# Patient Record
Sex: Male | Born: 1950 | Race: White | Hispanic: No | Marital: Single | Smoking: Current every day smoker
Health system: Southern US, Community
[De-identification: ages and names within clinical notes are randomized; demographics above are authoritative.]

## PROBLEM LIST (undated history)

## (undated) DIAGNOSIS — I639 Cerebral infarction, unspecified: Secondary | ICD-10-CM

## (undated) DIAGNOSIS — I2510 Atherosclerotic heart disease of native coronary artery without angina pectoris: Secondary | ICD-10-CM

---

## 2011-06-13 ENCOUNTER — Emergency Department (HOSPITAL_COMMUNITY): Payer: Medicare Other

## 2011-06-13 ENCOUNTER — Encounter: Payer: Self-pay | Admitting: Emergency Medicine

## 2011-06-13 ENCOUNTER — Emergency Department (HOSPITAL_COMMUNITY)
Admission: EM | Admit: 2011-06-13 | Discharge: 2011-06-13 | Disposition: A | Discharge status: Home / Self Care | Payer: Medicare Other | Attending: Emergency Medicine | Admitting: Emergency Medicine

## 2011-06-13 DIAGNOSIS — S0083XA Contusion of other part of head, initial encounter: Secondary | ICD-10-CM | POA: Insufficient documentation

## 2011-06-13 DIAGNOSIS — F17200 Nicotine dependence, unspecified, uncomplicated: Secondary | ICD-10-CM | POA: Insufficient documentation

## 2011-06-13 DIAGNOSIS — S0003XA Contusion of scalp, initial encounter: Secondary | ICD-10-CM | POA: Insufficient documentation

## 2011-06-13 DIAGNOSIS — S42133A Displaced fracture of coracoid process, unspecified shoulder, initial encounter for closed fracture: Secondary | ICD-10-CM | POA: Insufficient documentation

## 2011-06-13 DIAGNOSIS — W11XXXA Fall on and from ladder, initial encounter: Secondary | ICD-10-CM | POA: Insufficient documentation

## 2011-06-13 DIAGNOSIS — I2510 Atherosclerotic heart disease of native coronary artery without angina pectoris: Secondary | ICD-10-CM | POA: Insufficient documentation

## 2011-06-13 DIAGNOSIS — M25519 Pain in unspecified shoulder: Secondary | ICD-10-CM | POA: Insufficient documentation

## 2011-06-13 DIAGNOSIS — R51 Headache: Secondary | ICD-10-CM | POA: Insufficient documentation

## 2011-06-13 DIAGNOSIS — W19XXXA Unspecified fall, initial encounter: Secondary | ICD-10-CM

## 2011-06-13 DIAGNOSIS — S42123A Displaced fracture of acromial process, unspecified shoulder, initial encounter for closed fracture: Secondary | ICD-10-CM | POA: Insufficient documentation

## 2011-06-13 DIAGNOSIS — Z8673 Personal history of transient ischemic attack (TIA), and cerebral infarction without residual deficits: Secondary | ICD-10-CM | POA: Insufficient documentation

## 2011-06-13 DIAGNOSIS — S2239XA Fracture of one rib, unspecified side, initial encounter for closed fracture: Secondary | ICD-10-CM | POA: Insufficient documentation

## 2011-06-13 DIAGNOSIS — M542 Cervicalgia: Secondary | ICD-10-CM | POA: Insufficient documentation

## 2011-06-13 HISTORY — DX: Cerebral infarction, unspecified: I63.9

## 2011-06-13 HISTORY — DX: Atherosclerotic heart disease of native coronary artery without angina pectoris: I25.10

## 2011-06-13 LAB — CBC
HCT: 43.5 % (ref 39.0–52.0)
Hemoglobin: 15.8 g/dL (ref 13.0–17.0)
MCH: 31.2 pg (ref 26.0–34.0)
RBC: 5.07 MIL/uL (ref 4.22–5.81)

## 2011-06-13 LAB — COMPREHENSIVE METABOLIC PANEL
ALT: 14 U/L (ref 0–53)
Alkaline Phosphatase: 66 U/L (ref 39–117)
BUN: 14 mg/dL (ref 6–23)
CO2: 24 mEq/L (ref 19–32)
Calcium: 8.7 mg/dL (ref 8.4–10.5)
GFR calc Af Amer: >90 mL/min (ref >90)
GFR calc non Af Amer: >90 mL/min (ref >90)
Glucose, Bld: 113 mg/dL — ABNORMAL HIGH (ref 70–99)
Sodium: 134 mEq/L — ABNORMAL LOW (ref 135–145)

## 2011-06-13 LAB — PROTIME-INR: Prothrombin Time: 13.7 seconds (ref 11.6–15.2)

## 2011-06-13 MED ORDER — FENTANYL CITRATE 0.05 MG/ML IJ SOLN: 50.0000 ug | Freq: Once | INTRAMUSCULAR | Status: DC

## 2011-06-13 MED ORDER — ONDANSETRON HCL 4 MG/2ML IJ SOLN
4.0000 mg | Freq: Once | INTRAMUSCULAR | Status: AC
  Administered 2011-06-13: 4 mg via INTRAVENOUS
  Filled 2011-06-13 (×2): qty 2

## 2011-06-13 MED ORDER — FENTANYL CITRATE 0.05 MG/ML IJ SOLN
50.0000 ug | Freq: Once | INTRAMUSCULAR | Status: AC
  Administered 2011-06-13: 50 ug via INTRAVENOUS

## 2011-06-13 MED ORDER — FENTANYL CITRATE 0.05 MG/ML IJ SOLN
INTRAMUSCULAR | Status: AC
  Administered 2011-06-13: 50 ug via INTRAVENOUS
  Filled 2011-06-13: qty 2

## 2011-06-13 MED ORDER — HYDROCODONE-ACETAMINOPHEN 5-500 MG PO TABS: 1.0000 | ORAL_TABLET | Freq: Four times a day (QID) | ORAL | Status: AC | PRN

## 2011-06-13 MED ORDER — FENTANYL CITRATE 0.05 MG/ML IJ SOLN
50.0000 ug | Freq: Once | INTRAMUSCULAR | Status: AC
  Administered 2011-06-13: 50 ug via INTRAVENOUS
  Filled 2011-06-13: qty 2

## 2011-06-13 NOTE — ED Notes (Signed)
Received bedside report from [place], [place].  Patient currently sitting up in bed; no respiratory or acute distress noted.  Patient updated on plan of care; informed patient that we are currently waiting on discharge papers from EDP.  Patient given blue scrub top to wear home (original shirt was cut off).  Patient has no other questions or concerns at this time.  Introduced self to patient and updated whiteboard in room. Will continue to monitor.

## 2011-06-13 NOTE — ED Notes (Signed)
Patient given copy of discharge paperwork; went over discharge instructions with patient.  Instructed patient to follow up with primary care physician within three days, to follow up with orthopedic surgery, and to take Vicodin as directed.  Instructed to return to the ED for new, worsening, or concerning symptoms.

## 2011-06-13 NOTE — ED Notes (Signed)
Pt on a ladder 10 feet high and ladder "gave way", no loc, pt c/o left shoulder and arm pain and into his back and shoulder blade.

## 2011-06-13 NOTE — ED Notes (Signed)
PA aware of pt's mechanism of injury,complaints and location.

## 2011-06-13 NOTE — ED Notes (Signed)
Ortho at bedside; apply foam sling.

## 2011-06-13 NOTE — ED Provider Notes (Addendum)
History     CSN: [id number]  Arrival date & time 06/13/11  1402   First MD Initiated Contact with Patient 06/13/11 1419      Chief Complaint  Patient presents with  . Fall    fall 10 feet from ladder. landed on left side and left shoulder deformity , arrived on LSB fully immobilized.  no loc. c collar in place.    (Consider location/radiation/quality/duration/timing/severity/associated sxs/prior treatment) HPI  Pt presents to the ED after having fallen off of a 10 foot ladder that "gave way". Pt denies falling off of the later for dizziness, headache, chest pain, weakness, syncope. The fall was mechanical. He states he fell on his right arm, which he has some paralysis in, and hit his shoulder and head as well. The patient describes his pain as being in his head and right shoulder. Denies pain anywhere else or any other symptoms. Pt is currently on LSB and in C-collar. Pt denies being on blood thinners.  Past Medical History  Diagnosis Date  . Coronary artery disease     mi with 2 stents placed in 2010  . Stroke     during coratid artery surgery and left hand weakness. since.    History reviewed. No pertinent past surgical history.  History reviewed. No pertinent family history.  History  Substance Use Topics  . Smoking status: Current Everyday Smoker -- 0.5 packs/day for 40 years    Types: Cigarettes  . Smokeless tobacco: Not on file  . Alcohol Use: No      Review of Systems  All other systems reviewed and are negative.    Allergies  Penicillins  Home Medications  No current outpatient prescriptions on file.  BP 128/78  Pulse 71  Temp(Src) 98.6 F (37 C) (Oral)  Resp 16  Ht 5\' 7"  (1.702 m)  Wt 155 lb (70.308 kg)  BMI 24.28 kg/m2  SpO2 96%  Physical Exam  Constitutional: He is oriented to person, place, and time. He appears well-developed and well-nourished.  HENT:  Head: Normocephalic and atraumatic.  Eyes: EOM are normal. Pupils are equal, round,  and reactive to light.  Neck: Normal range of motion.  Cardiovascular: Normal rate and regular rhythm.   Pulmonary/Chest: Effort normal and breath sounds normal.  Musculoskeletal:       Right shoulder: He exhibits decreased range of motion, tenderness, bony tenderness, swelling, pain and decreased strength. He exhibits no crepitus, no deformity, no laceration and no spasm.       Arms: Neurological: He is oriented to person, place, and time.  Skin: Skin is warm and dry.    ED Course  Procedures (including critical care time)  Labs Reviewed  CBC - Abnormal; Notable for the following:    WBC 15.0 (*)    MCHC 36.3 (*)    All other components within normal limits  COMPREHENSIVE METABOLIC PANEL - Abnormal; Notable for the following:    Sodium 134 (*)    Glucose, Bld 113 (*)    All other components within normal limits  PROTIME-INR  LAB REPORT - SCANNED        DG Pelvis 1-2 Views (Final result)   Result time:06/13/11 [id number]    Final result by Rad Results In Interface (06/13/11 18:48:17)    Narrative:   *RADIOLOGY REPORT*  Clinical Data: 61 year old male with pelvic pain following fall.  PELVIS - 1-2 VIEW  Comparison: None  Findings: No evidence of acute fracture, subluxation or dislocation identified.  No radio-opaque foreign  bodies are present.  No focal bony lesions are noted.  The joint spaces are unremarkable.  IMPRESSION: No evidence of acute bony abnormality.  Original Report Authenticated By: Rosendo Gros, M.D.            DG Abd 1 View (Final result)   Result time:06/13/11 [id number]    Final result by Rad Results In Interface (06/13/11 18:47:25)    Narrative:   *RADIOLOGY REPORT*  Clinical Data: Fall from ladder  ABDOMEN - 1 VIEW  Comparison: None.  Findings: Nonobstructive bowel gas pattern.  Visualized osseous structures are within normal limits.  IMPRESSION: No evidence of bowel obstruction.  Original Report Authenticated By: Charline Bills,  M.D.            DG Chest 2 View (Final result)   Result time:06/13/11 [phone]    Final result by Rad Results In Interface (06/13/11 18:46:18)    Narrative:   *RADIOLOGY REPORT*  Clinical Data: Fall from ladder  CHEST - 2 VIEW  Comparison: None.  Findings: Lungs are clear. No pleural effusion or pneumothorax.  Cardiomediastinal silhouette is within normal limits.  Mild degenerative changes of the visualized thoracolumbar spine. Cervical spine fixation hardware.  Left posterior eighth rib fracture, age indeterminate.  IMPRESSION: No evidence of acute cardiopulmonary disease.  Left posterior eighth rib fracture, age indeterminate.  Original Report Authenticated By: Charline Bills, M.D.            DG Hand Complete Left (Final result)   Result time:06/13/11 1628    Final result by Rad Results In Interface (06/13/11 16:28:47)    Narrative:   *RADIOLOGY REPORT*  Clinical Data: Trauma/fall from ladder  LEFT HAND - COMPLETE 3+ VIEW  Comparison: None.  Findings: No fracture is seen.  Chronic deformity/subluxation of the first digit at the MCP joint.  Narrowing at the radiocarpal articulation.  The visualized soft tissues are unremarkable.  IMPRESSION: No fracture is seen.  Chronic deformity/subluxation of the first digit at the MCP joint.  Original Report Authenticated By: Charline Bills, M.D.            DG Forearm Left (Final result)   Result time:06/13/11 1627    Final result by Rad Results In Interface (06/13/11 16:27:17)    Narrative:   *RADIOLOGY REPORT*  Clinical Data: Trauma/fall from ladder  LEFT FOREARM - 2 VIEW  Comparison: None.  Findings: No fracture or dislocation is seen.  The visualized soft tissues are unremarkable.  IMPRESSION: No fracture or dislocation is seen.  Original Report Authenticated By: Charline Bills, M.D.            DG Elbow Complete Left (Final result)   Result time:06/13/11 1626    Final result by Rad  Results In Interface (06/13/11 16:26:46)    Narrative:   *RADIOLOGY REPORT*  Clinical Data: Trauma/fall from ladder  LEFT ELBOW - COMPLETE 3+ VIEW  Comparison: None.  Findings: No fracture or dislocation is seen.  The joint spaces are preserved.  The visualized soft tissues are unremarkable.  No displaced elbow joint fat pads to suggest an elbow joint effusion.  IMPRESSION: No fracture or dislocation is seen.  Original Report Authenticated By: Charline Bills, M.D.            DG Shoulder Left (Final result)   Result time:06/13/11 1625    Final result by Rad Results In Interface (06/13/11 16:25:24)    Narrative:   *RADIOLOGY REPORT*  Clinical Data: Trauma/fall from ladder, left shoulder pain  LEFT SHOULDER -  2+ VIEW  Comparison: None.  Findings: Mildly displaced fractures involving the acromion and coracoid process.  Healing fracture of the left fourth rib.  Cervical spine fixation hardware.  IMPRESSION: Mildly displaced fractures involving the acromion and coracoid process.  Original Report Authenticated By: Charline Bills, M.D.            CT Head Wo Contrast (Final result)   Result time:06/13/11 1459    Final result by Rad Results In Interface (06/13/11 14:59:56)    Narrative:   *RADIOLOGY REPORT*  Clinical Data: Fall from ladder, headache/neck pain, occipital hematoma  CT HEAD WITHOUT CONTRAST CT CERVICAL SPINE WITHOUT CONTRAST  Technique: Multidetector CT imaging of the head and cervical spine was performed following the standard protocol without intravenous contrast. Multiplanar CT image reconstructions of the cervical spine were also generated.  Comparison: None  CT HEAD  Findings: No evidence of parenchymal hemorrhage or extra-axial fluid collection. No mass lesion, mass effect, or midline shift.  No CT evidence of acute infarction.  Cerebral volume is age appropriate. No ventriculomegaly.  The visualized paranasal sinuses  are essentially clear. The mastoid air cells are unopacified.  Small extracranial hematoma overlying the left parietal scalp.  No underlying osseous abnormality. No evidence of calvarial fracture.  IMPRESSION: Small extracranial hematoma overlying the left parietal scalp.  No evidence of calvarial fracture.  No evidence of acute intracranial abnormality.  CT CERVICAL SPINE  Findings: Normal cervical lordosis.  No evidence of fracture or dislocation. Vertebral body heights are maintained. Dens appears intact.  No prevertebral soft tissue swelling.  Anterior cervical fixation with interbody fusion at C5-6. No evidence of hardware complication.  Mild multilevel degenerate changes.  Visualized lung apices are clear.  IMPRESSION:  No evidence of traumatic injury to the cervical spine.  Status post ACDF at C5-6.  Original Report Authenticated By: Charline Bills, M.D.            CT Cervical Spine Wo Contrast (Final result)   Result time:06/13/11 1459    Final result by Rad Results In Interface (06/13/11 14:59:56)    Narrative:   *RADIOLOGY REPORT*  Clinical Data: Fall from ladder, headache/neck pain, occipital hematoma  CT HEAD WITHOUT CONTRAST CT CERVICAL SPINE WITHOUT CONTRAST  Technique: Multidetector CT imaging of the head and cervical spine was performed following the standard protocol without intravenous contrast. Multiplanar CT image reconstructions of the cervical spine were also generated.  Comparison: None  CT HEAD  Findings: No evidence of parenchymal hemorrhage or extra-axial fluid collection. No mass lesion, mass effect, or midline shift.  No CT evidence of acute infarction.  Cerebral volume is age appropriate. No ventriculomegaly.  The visualized paranasal sinuses are essentially clear. The mastoid air cells are unopacified.  Small extracranial hematoma overlying the left parietal scalp.  No underlying osseous abnormality. No  evidence of calvarial fracture.  IMPRESSION: Small extracranial hematoma overlying the left parietal scalp.  No evidence of calvarial fracture.  No evidence of acute intracranial abnormality.  CT CERVICAL SPINE  Findings: Normal cervical lordosis.  No evidence of fracture or dislocation. Vertebral body heights are maintained. Dens appears intact.  No prevertebral soft tissue swelling.  Anterior cervical fixation with interbody fusion at C5-6. No evidence of hardware complication.  Mild multilevel degenerate changes.  Visualized lung apices are clear.  IMPRESSION:  No evidence of traumatic injury to the cervical spine.  Status post ACDF at C5-6.  Original Report Authenticated By: Charline Bills, M.D.      1. Fracture of acromion  of scapula   2. Fracture of coracoid process of scapula   3. Fall       MDM    Pt has received a CT of the head and neck which show no acute findings but do show a right parietal extracranial hematoma. The patients Shoulder xray is positive for an acromion fracture and a caracoid process fracture.  After the patient being examined by Dr. Rosalia Hammers I have added on a CBC, PT/INR, CMP, ABD and PELV xray and a chest xray to exclude any other injuries. The patient is awake and alert. Pt currently being moved to a room from the hallway for re-examination.   End of shift, pts car will be handed off to [place], the ER Resident on the Blue side.       Dorthula Matas, PA 06/15/11 0640  Dorthula Matas, PA 07/27/11 [phone]

## 2011-06-13 NOTE — ED Provider Notes (Signed)
1600-Patient care transferred from PA. 61 year old male with recent fall from ladder. Complaining of left shoulder pain. X-rays of left shoulder show fractures of the coracoid and acromion process. X-rays of left hand and left elbow both negative for acute fracture. CT of head and neck negative. Lab work unremarkable. Currently awaiting chest x-ray and pelvis x-ray. A sling for comfort for shoulder fractures. Made to films negative for acute fracture. All been reviewed by me. Discharge home with Vicodin and orthopedic follow up in 2 weeks. Also instructed to followup with PCP in 3-5 days.   Elwin Mocha, MD 06/13/11 [phone]

## 2011-06-13 NOTE — ED Notes (Signed)
Charge nurse aware pt in hallway and injuries.

## 2011-06-13 NOTE — Progress Notes (Signed)
Orthopedic Tech Progress Note Patient Details:  Joel Reyes [birth year] [id number]  Other Ortho Devices Type of Ortho Device: Other (comment) (foam arm sling) Ortho Device Location: left arm Ortho Device Interventions: Application   [name] 06/13/2011, 7:32 PM

## 2011-06-19 NOTE — ED Provider Notes (Signed)
History/physical exam/procedure(s) were performed by non-physician practitioner and as supervising physician I was immediately available for consultation/collaboration. I have reviewed all notes and am in agreement with care and plan. I saw and evaluated patient.  VSS, left shoulder pain, Chest and adomen, pelvis and lower extremities normal.  Hilario Quarry, MD 06/19/11 [phone]

## 2011-06-19 NOTE — ED Provider Notes (Signed)
I saw and examined the patient with Ms. Green.  He has pain to his left shoulder and other work up is negative.    Hilario Quarry, MD 06/19/11 [phone]

## 2011-07-27 NOTE — ED Provider Notes (Signed)
Agree with assessment and plan.   Hilario Quarry, MD 07/27/11 0800

## 2012-08-27 IMAGING — CT CT HEAD W/O CM
6 of 9 series · 18 of 47 positions shown, 19 images · non-contrast
Comparison: None

CT HEAD

CLINICAL DATA: Fall from ladder, headache/neck pain, occipital
hematoma

CT HEAD WITHOUT CONTRAST
CT CERVICAL SPINE WITHOUT CONTRAST
TECHNIQUE: Multidetector CT imaging of the head and cervical spine
was performed following the standard protocol without intravenous
contrast.  Multiplanar CT image reconstructions of the cervical
spine were also generated.

[Series 3: recon 2: brain · axial · 0.47mm/px · z∈[+114,+213]mm · 4 of 64 slices shown]
[im 13/64  brain]
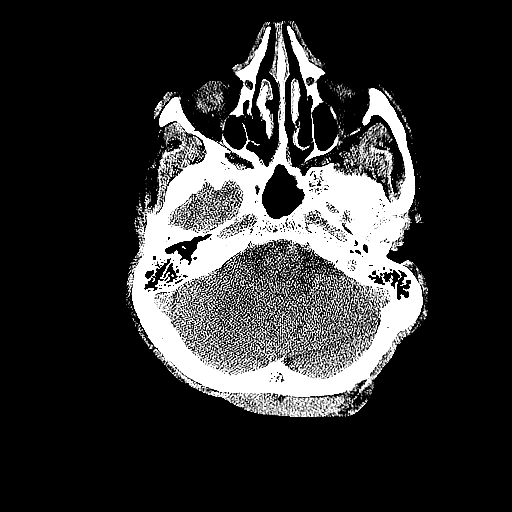
[im 26/64  brain]
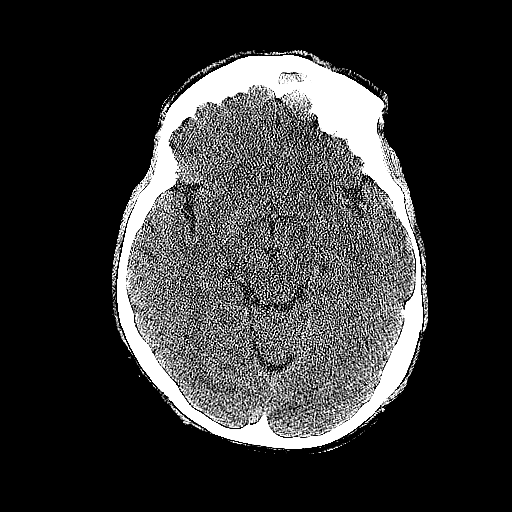
[im 38/64  brain]
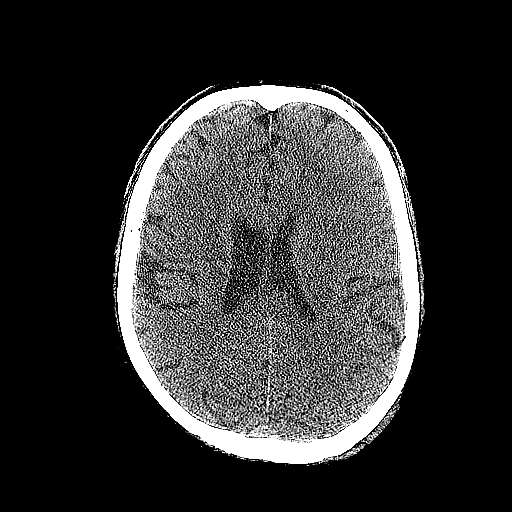
[im 51/64  brain]
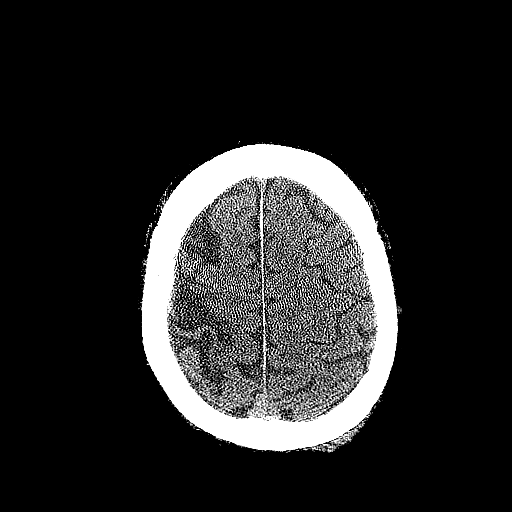

[Series 4: cervical spine · axial · 0.25mm/px · z∈[-73,-0]mm · 3 of 73 slices shown]
[im 15/73  brain]
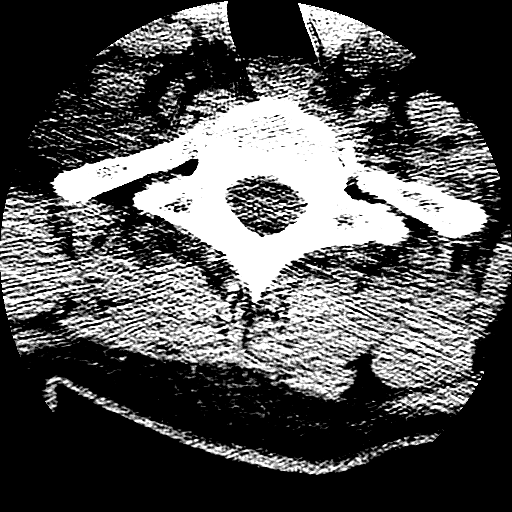
[im 29/73  brain]
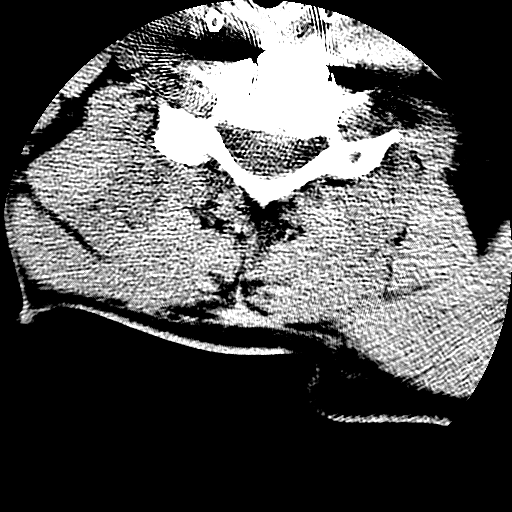
[im 44/73  brain]
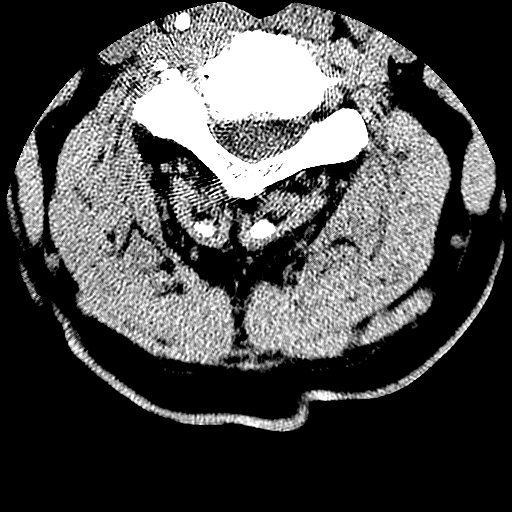

[Series 601: lower cor · coronal · 0.36mm/px · 3 of 44 slices shown]
[im 15/44  brain]
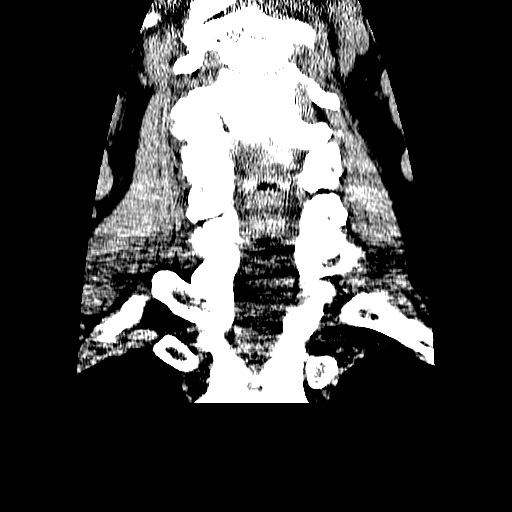
[im 20/44  brain]
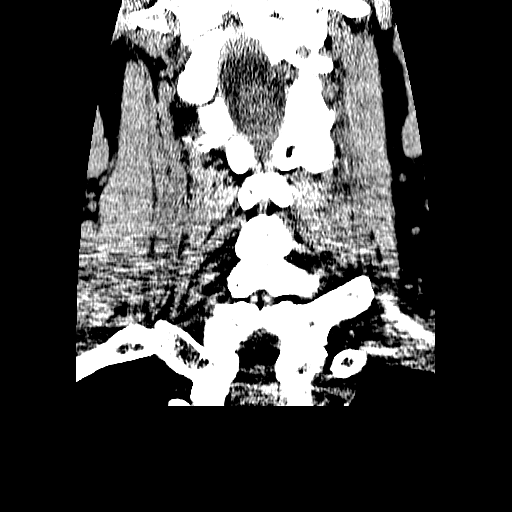
[im 24/44  brain]
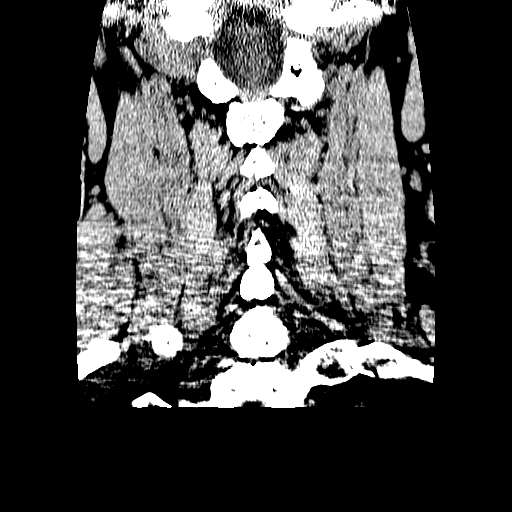

[Series 602: sag · sagittal · 0.36mm/px · 3 of 41 slices shown]
[im 9/41  brain]
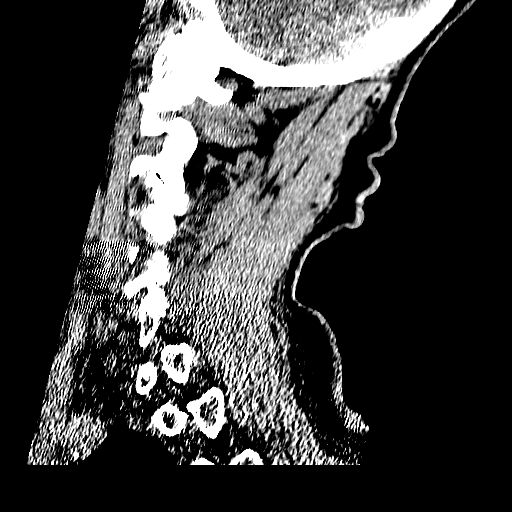
[im 17/41  brain]
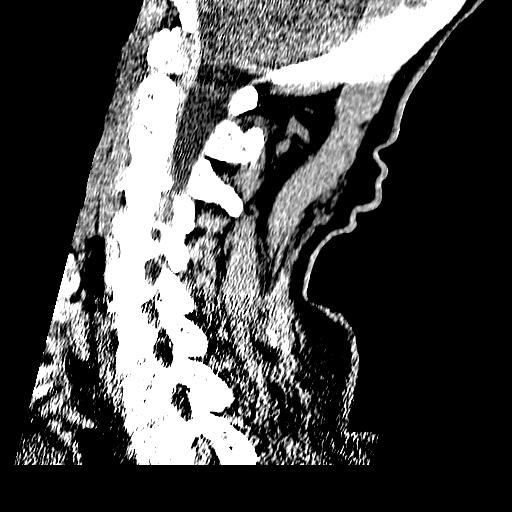
[im 25/41  brain]
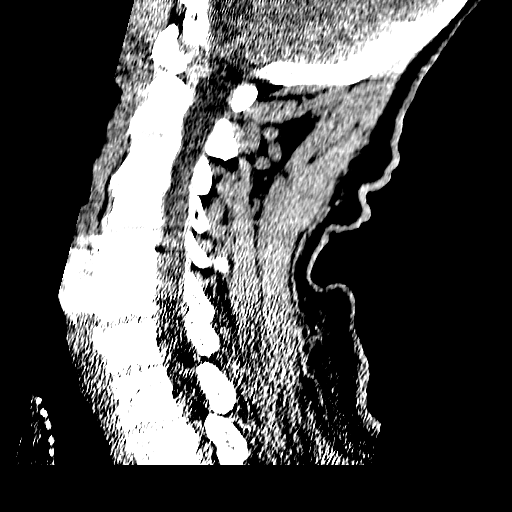

[Series 603: ortho upper · axial · 0.36mm/px · z∈[-11,+45]mm · 3 of 57 slices shown]
[im 15/57  brain]
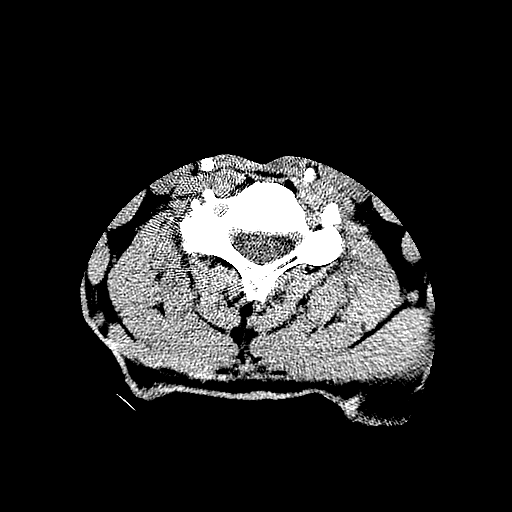
[im 29/57  brain]
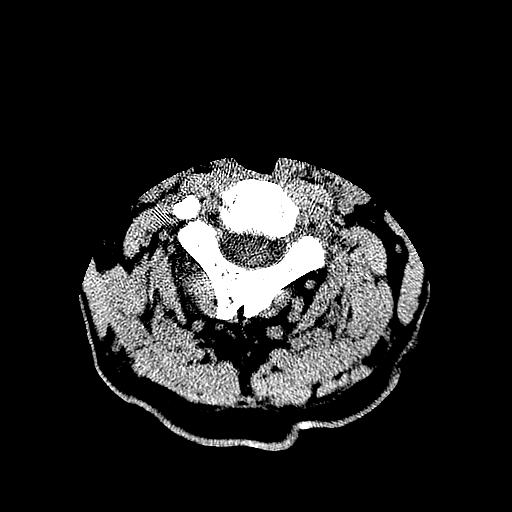
[im 43/57  brain]
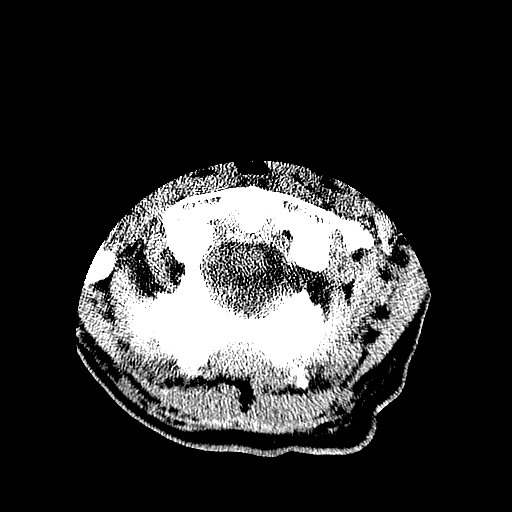

[Series 604: ortho lower · axial · 0.36mm/px · z∈[-102,-74]mm · 2 of 47 slices shown, 3 images]
[im 16/47  brain]
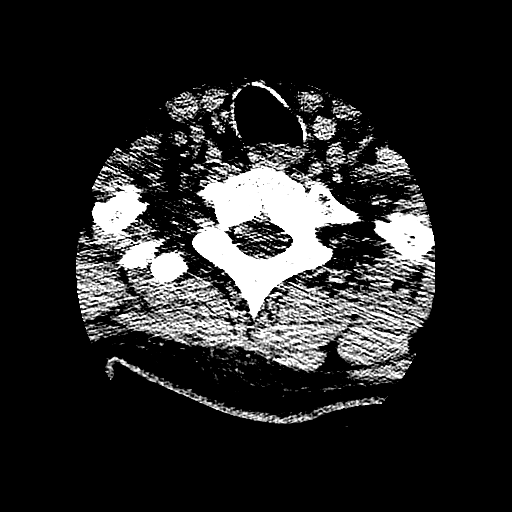
[im 16/47  bone]
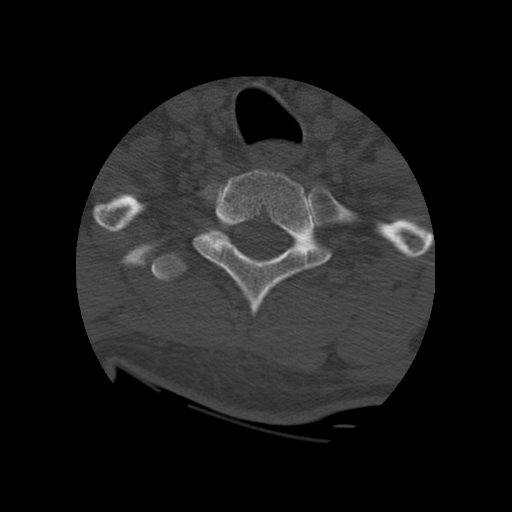
[im 31/47  brain]
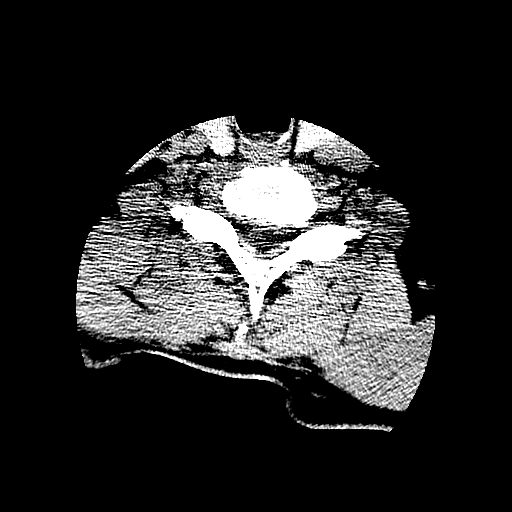

[18 of 47 positions shown; findings below may reference images not displayed]

FINDINGS: No evidence of parenchymal hemorrhage or extra-axial
fluid collection. No mass lesion, mass effect, or midline shift.

No CT evidence of acute infarction.

Cerebral volume is age appropriate.  No ventriculomegaly.

The visualized paranasal sinuses are essentially clear. The mastoid
air cells are unopacified.

Small extracranial hematoma overlying the left parietal scalp.

No underlying osseous abnormality. No evidence of calvarial
fracture.
IMPRESSION: Small extracranial hematoma overlying the left parietal scalp.

No evidence of calvarial fracture.

No evidence of acute intracranial abnormality.

CT CERVICAL SPINE
FINDINGS: Normal cervical lordosis.

No evidence of fracture or dislocation.  Vertebral body heights are
maintained.  Dens appears intact.

No prevertebral soft tissue swelling.

Anterior cervical fixation with interbody fusion at C5-6.  No
evidence of hardware complication.

Mild multilevel degenerate changes.

Visualized lung apices are clear.
IMPRESSION: No evidence of traumatic injury to the cervical spine.

Status post ACDF at C5-6.

## 2022-04-16 ENCOUNTER — Emergency Department (HOSPITAL_COMMUNITY)
Admission: EM | Admit: 2022-04-16 | Discharge: 2022-05-08 | Disposition: E | Payer: Medicare HMO | Attending: Emergency Medicine | Admitting: Emergency Medicine

## 2022-04-16 DIAGNOSIS — I469 Cardiac arrest, cause unspecified: Secondary | ICD-10-CM | POA: Insufficient documentation

## 2022-04-16 DIAGNOSIS — F17200 Nicotine dependence, unspecified, uncomplicated: Secondary | ICD-10-CM | POA: Insufficient documentation

## 2022-04-16 DIAGNOSIS — I2510 Atherosclerotic heart disease of native coronary artery without angina pectoris: Secondary | ICD-10-CM | POA: Insufficient documentation

## 2022-05-08 NOTE — ED Notes (Signed)
Spoke with pt placement. Pt able to go to morgue.

## 2022-05-08 NOTE — ED Provider Notes (Signed)
[facility] EMERGENCY DEPARTMENT Provider Note   CSN: [id number] Arrival date & time: 04-28-22  0830     History  No chief complaint on file.   Joel Reyes is a 71 y.o. male.  Pt is a 71 yo male with a pmhx significant for CAD, CVA, and tobacco abuse.  Pt was with his family this am in the barn and had a witnessed collapse around 0725.  Bystander CPR was started and EMS was called.  EMS found pt to initially be in vfib.  He was shocked several times.  Epi and mg given.  Pt is now in asystole.  He had no ROSC.  He was orally intubated by EMS.  He is unable to give any hx.       Home Medications Prior to Admission medications   Not on File      Allergies    Penicillins    Review of Systems   Review of Systems  Unable to perform ROS: Patient unresponsive    Physical Exam Updated Vital Signs Ht 5\' 7"  (1.702 m)   Wt 70.3 kg   BMI 24.27 kg/m  Physical Exam Vitals and nursing note reviewed.  Constitutional:      Interventions: He is intubated.  HENT:     Head: Atraumatic.     Right Ear: External ear normal.     Left Ear: External ear normal.     Nose: Nose normal.     Mouth/Throat:     Mouth: Mucous membranes are dry.  Eyes:     Comments: Pupils are dilated  Neck:     Trachea: Trachea normal.  Cardiovascular:     Comments: No HR Pulmonary:     Effort: He is intubated.     Comments: BS better on right with ETT.  Tube pulled back.  Now bs equal with bagging.  No spontaneous breathing. Abdominal:     General: Abdomen is flat.  Musculoskeletal:        General: No signs of injury.  Skin:    Capillary Refill: Capillary refill takes more than 3 seconds.     Coloration: Skin is cyanotic and mottled.  Neurological:     Mental Status: He is unresponsive.     ED Results / Procedures / Treatments   Labs (all labs ordered are listed, but only abnormal results are displayed) Labs Reviewed - No data to display  EKG EKG  Interpretation  Date/Time:  28-Apr-2022 08:37:44 EST Ventricular Rate:  0 PR Interval:    QRS Duration:   QT Interval:    QTC Calculation:   R Axis:   0 Text Interpretation: Uncertain rhythm: review No further analysis attempted - not enough leads could be measured Pt has expired.  Confirmed by Isla Pence [phone]) on 04-28-22 8:44:13 AM  Radiology No results found.  Procedures Procedures    Medications Ordered in ED Medications - No data to display  ED Course/ Medical Decision Making/ A&P                           Medical Decision Making  This patient presents to the ED for concern of cardiac arrest, this involves an extensive number of treatment options, and is a complaint that carries with it a high risk of complications and morbidity.  The differential diagnosis includes heart attack, dissection, stroke   Co morbidities that complicate the patient evaluation  CAD, CVA, and tobacco abuse  Additional history obtained:  Additional history obtained from epic chart review External records from outside source obtained and reviewed including EMS report   Cardiac Monitoring:  The patient was maintained on a cardiac monitor.  I personally viewed and interpreted the cardiac monitored which showed an underlying rhythm of: asystole   Medicines ordered and prescription drug management:   I have reviewed the patients home medicines and have made adjustments as needed   Problem List / ED Course:  Cardiac arrest:  likely due to heart attack.  Pt has a hx of CAD and was in vfib when EMS arrived.   Reevaluation:  After the interventions noted above, I reevaluated the patient and found that they have :stayed the same   Social Determinants of Health:  Lives at home   Dispostion:  Pt has expired.  I don't think it is a ME case.  TOD [id number].  Pt's family notified.  Adelina Mings, at [facility] in [place] (PCP is Tobie Lords, PA at [facility])  notified.   CRITICAL CARE Performed by: Jacalyn Lefevre   Total critical care time: 30 minutes  Critical care time was exclusive of separately billable procedures and treating other patients.  Critical care was necessary to treat or prevent imminent or life-threatening deterioration.  Critical care was time spent personally by me on the following activities: development of treatment plan with patient and/or surrogate as well as nursing, discussions with consultants, evaluation of patient's response to treatment, examination of patient, obtaining history from patient or surrogate, ordering and performing treatments and interventions, ordering and review of laboratory studies, ordering and review of radiographic studies, pulse oximetry and re-evaluation of patient's condition.            Final Clinical Impression(s) / ED Diagnoses Final diagnoses:  Cardiac arrest [facility])    Rx / DC Orders ED Discharge Orders     None         Jacalyn Lefevre, MD 18-Apr-2022 1004

## 2022-05-08 NOTE — ED Notes (Signed)
Dr. Particia Nearing updated daughter who lives in [place]. Son on way from [place].

## 2022-05-08 NOTE — ED Notes (Signed)
Son Samuel Bouche at bedside.

## 2022-05-08 NOTE — ED Notes (Signed)
Pt best friend at bedside.

## 2022-05-08 NOTE — ED Notes (Signed)
This RN was able to draw an istat from a 20g in the [facility] @ 0830, TOD was called at [phone] & EDP [place] said not to run the lab.

## 2022-05-08 NOTE — ED Notes (Signed)
Patients best friend at bedside.

## 2022-05-08 NOTE — ED Triage Notes (Addendum)
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 Pt arrested at 0725. EMS placed an IO and gave 1,000 mL of NS. Pt was originally found in V-fib. Ems provided 13 shocks, 450 mg of amiodarone, and 10 rounds of epi administered. Pt went asystole in the field and was asystole upon arrival with active CPR in progress.

## 2022-05-08 NOTE — ED Notes (Signed)
ME at bedside

## 2022-05-08 DEATH — deceased
